# Patient Record
Sex: Female | Born: 1980 | Race: Black or African American | Hispanic: No | Marital: Single | Smoking: Never smoker
Health system: Southern US, Community
[De-identification: ages and names within clinical notes are randomized; demographics above are authoritative.]

## PROBLEM LIST (undated history)

## (undated) DIAGNOSIS — R51 Headache: Secondary | ICD-10-CM

## (undated) DIAGNOSIS — D649 Anemia, unspecified: Secondary | ICD-10-CM

## (undated) DIAGNOSIS — M25561 Pain in right knee: Secondary | ICD-10-CM

## (undated) DIAGNOSIS — J45909 Unspecified asthma, uncomplicated: Secondary | ICD-10-CM

## (undated) DIAGNOSIS — K760 Fatty (change of) liver, not elsewhere classified: Secondary | ICD-10-CM

## (undated) DIAGNOSIS — G4700 Insomnia, unspecified: Secondary | ICD-10-CM

## (undated) DIAGNOSIS — N390 Urinary tract infection, site not specified: Secondary | ICD-10-CM

## (undated) DIAGNOSIS — Z8759 Personal history of other complications of pregnancy, childbirth and the puerperium: Secondary | ICD-10-CM

## (undated) DIAGNOSIS — R140 Abdominal distension (gaseous): Secondary | ICD-10-CM

## (undated) DIAGNOSIS — R519 Headache, unspecified: Secondary | ICD-10-CM

## (undated) DIAGNOSIS — M25562 Pain in left knee: Secondary | ICD-10-CM

## (undated) HISTORY — DX: Headache: R51

## (undated) HISTORY — DX: Urinary tract infection, site not specified: N39.0

## (undated) HISTORY — DX: Unspecified asthma, uncomplicated: J45.909

## (undated) HISTORY — DX: Pain in right knee: M25.561

## (undated) HISTORY — DX: Personal history of other complications of pregnancy, childbirth and the puerperium: Z87.59

## (undated) HISTORY — DX: Headache, unspecified: R51.9

## (undated) HISTORY — DX: Insomnia, unspecified: G47.00

## (undated) HISTORY — DX: Abdominal distension (gaseous): R14.0

## (undated) HISTORY — DX: Fatty (change of) liver, not elsewhere classified: K76.0

## (undated) HISTORY — DX: Morbid (severe) obesity due to excess calories: E66.01

## (undated) HISTORY — DX: Pain in left knee: M25.562

## (undated) HISTORY — DX: Anemia, unspecified: D64.9

---

## 2015-07-14 VITALS — BP 118/82 | HR 71 | Temp 98.6°F | Ht 63.0 in | Wt 234.0 lb

## 2015-07-14 DIAGNOSIS — G43801 Other migraine, not intractable, with status migrainosus: Secondary | ICD-10-CM | POA: Diagnosis not present

## 2015-07-14 DIAGNOSIS — R51 Headache: Secondary | ICD-10-CM

## 2015-07-14 DIAGNOSIS — R519 Headache, unspecified: Secondary | ICD-10-CM

## 2015-07-14 DIAGNOSIS — D649 Anemia, unspecified: Secondary | ICD-10-CM

## 2015-07-14 NOTE — Progress Notes (Signed)
Pre visit review using our clinic review tool, if applicable. No additional management support is needed unless otherwise documented below in the visit note. 

## 2015-07-17 DIAGNOSIS — R519 Headache, unspecified: Secondary | ICD-10-CM | POA: Insufficient documentation

## 2015-07-17 DIAGNOSIS — Z8759 Personal history of other complications of pregnancy, childbirth and the puerperium: Secondary | ICD-10-CM | POA: Insufficient documentation

## 2015-07-17 DIAGNOSIS — J45909 Unspecified asthma, uncomplicated: Secondary | ICD-10-CM | POA: Insufficient documentation

## 2015-07-17 DIAGNOSIS — I10 Essential (primary) hypertension: Secondary | ICD-10-CM | POA: Insufficient documentation

## 2015-07-17 DIAGNOSIS — R51 Headache: Secondary | ICD-10-CM

## 2015-07-17 DIAGNOSIS — D649 Anemia, unspecified: Secondary | ICD-10-CM

## 2015-07-17 HISTORY — DX: Anemia, unspecified: D64.9

## 2015-07-17 NOTE — Assessment & Plan Note (Addendum)
Patient relays a history of headaches with migrainous components of N/V/photophobia and phonophobia dating back to middle school. Has experienced periods of increased intensity and frequency followed by long periods of doing better. She is presently having a rough patch recently. Over past year the frequency of her headaches and the intensity has worsened.  Will try Imitrex and Batalbitol prn, Encouraged increased hydration, 64 ounces of clear fluids daily. Minimize alcohol and caffeine. Eat small frequent meals with lean proteins and complex carbs. Avoid high and low blood sugars. Get adequate sleep, 7-8 hours a night. Needs exercise daily preferably in the morning. Labs unremarkable. If continues to worsen will refer to neurology

## 2015-07-19 NOTE — Telephone Encounter (Signed)
Called the patient informed her of her lab results dated 07/14/2015.  The patient verbally understood results/instructions

## 2015-08-25 VITALS — BP 108/68 | HR 78 | Temp 98.7°F | Ht 63.0 in | Wt 232.5 lb

## 2015-08-25 DIAGNOSIS — R140 Abdominal distension (gaseous): Secondary | ICD-10-CM | POA: Diagnosis not present

## 2015-08-25 DIAGNOSIS — R519 Headache, unspecified: Secondary | ICD-10-CM

## 2015-08-25 DIAGNOSIS — R51 Headache: Secondary | ICD-10-CM

## 2015-08-25 NOTE — Progress Notes (Signed)
Pre visit review using our clinic review tool, if applicable. No additional management support is needed unless otherwise documented below in the visit note. 

## 2015-09-04 DIAGNOSIS — R140 Abdominal distension (gaseous): Secondary | ICD-10-CM

## 2015-09-04 HISTORY — DX: Abdominal distension (gaseous): R14.0

## 2015-09-04 NOTE — Assessment & Plan Note (Signed)
Avoid offending foods, start probiotics. Do not eat large meals in late evening and consider raising head of bed.  

## 2015-09-04 NOTE — Progress Notes (Signed)
Subjective:    Patient ID: Teresa Mendoza, female    DOB: 10-23-1980, 35 y.o.   MRN: [id number]  Chief Complaint  Patient presents with  . Headache    HPI Patient is in today for follow up onheadaches. She has not been able to take sumatriptan due to cost concerns. Agrees to start soon Taking excedrin, works slightly. Occurs every other day, no real correlation to time of day. Hurts worse in the front of the head and bitemporally sometimes. Pain is not throbbing, lasts for 30-60 minutes. No aura described. Denies n/v/photophobia since last visit. Denies vision changes. Overall feels stress in life is not related. Adequate sleep, 6-7 hours.  Recently stopped drinking caffeine, does not drink alcohol. Will start headache log in [employer] App. Endorses feeling bloated at times, no anorexia, bloody or tarry stool. Denies CP/palp/SOB/HA/congestion/fevers or GU c/o. Taking meds as prescribed    Past Medical History  Diagnosis Date  . Frequent headaches   . UTI (lower urinary tract infection)   . Asthma   . History of gestational hypertension   . Anemia 07/17/2015  . Abdominal bloating 09/04/2015    Past Surgical History  Procedure Laterality Date  . Cesarean section  2008    Family History  Problem Relation Age of Onset  . Hyperlipidemia Mother   . Mental illness Mother   . Hypertension Mother   . Diabetes Mother   . Asthma Daughter   . Heart disease Maternal Uncle   . Arthritis Maternal Grandmother   . Heart disease Maternal Grandfather     Social History   Social History  . Marital Status: Single    Spouse Name: N/A  . Number of Children: N/A  . Years of Education: N/A   Occupational History  . Lab [employer]    Social History Main Topics  . Smoking status: Never Smoker   . Smokeless tobacco: Not on file  . Alcohol Use: 0.0 oz/week    0 Standard drinks or equivalent per week  . Drug Use: No  . Sexual Activity: Not on file   Other Topics Concern  . Not on file    Social History Narrative    Outpatient Prescriptions Prior to Visit  Medication Sig Dispense Refill  . SUMAtriptan (IMITREX) 100 MG tablet Take 1 tablet (100 mg total) by mouth every 2 (two) hours as needed for migraine. May repeat in 2 hours if headache persists or recurs. (Patient not taking: Reported on 08/25/2015) 10 tablet 5  . butalbital-acetaminophen-caffeine (FIORICET) 50-325-40 MG tablet Take 1-2 tablets by mouth daily. Reported on 08/25/2015     No facility-administered medications prior to visit.    No Known Allergies  Review of Systems  Constitutional: Negative for fever and malaise/fatigue.  HENT: Negative for congestion.   Eyes: Negative for discharge.  Respiratory: Negative for shortness of breath.   Cardiovascular: Negative for chest pain, palpitations and leg swelling.  Gastrointestinal: Positive for abdominal pain. Negative for nausea.  Genitourinary: Negative for dysuria.  Musculoskeletal: Negative for falls.  Skin: Negative for rash.  Neurological: Positive for headaches. Negative for loss of consciousness.  Endo/Heme/Allergies: Negative for environmental allergies.  Psychiatric/Behavioral: Negative for depression. The patient is not nervous/anxious.        Objective:    Physical Exam  Constitutional: She is oriented to person, place, and time. She appears well-developed and well-nourished. No distress.  HENT:  Head: Normocephalic and atraumatic.  Nose: Nose normal.  Eyes: Right eye exhibits no discharge. Left eye  exhibits no discharge.  Neck: Normal range of motion. Neck supple.  Cardiovascular: Normal rate and regular rhythm.   No murmur heard. Pulmonary/Chest: Effort normal and breath sounds normal.  Abdominal: Soft. Bowel sounds are normal. There is no tenderness.  Musculoskeletal: She exhibits no edema.  Neurological: She is alert and oriented to person, place, and time.  Skin: Skin is warm and dry.  Psychiatric: She has a normal mood and  affect.  Nursing note and vitals reviewed.   BP 108/68 mmHg  Pulse 78  Temp(Src) 98.7 F (37.1 C) (Oral)  Ht  (1.6 m)  Wt 232 lb 8 oz (105.461 kg)  BMI 41.20 kg/m2  SpO2 97% Wt Readings from Last 3 Encounters:  08/25/15 232 lb 8 oz (105.461 kg)  07/14/15 234 lb (106.142 kg)     Lab Results  Component Value Date   WBC 6.6 07/14/2015   HGB 11.2* 07/14/2015   HCT 35.5* 07/14/2015   PLT 329.0 07/14/2015   GLUCOSE 81 07/14/2015   ALT 11 07/14/2015   AST 14 07/14/2015   NA 140 07/14/2015   K 3.9 07/14/2015   CL 106 07/14/2015   CREATININE 0.84 07/14/2015   BUN 8 07/14/2015   CO2 28 07/14/2015   TSH 2.16 07/14/2015    Lab Results  Component Value Date   TSH 2.16 07/14/2015   Lab Results  Component Value Date   WBC 6.6 07/14/2015   HGB 11.2* 07/14/2015   HCT 35.5* 07/14/2015   MCV 72.9* 07/14/2015   PLT 329.0 07/14/2015   Lab Results  Component Value Date   NA 140 07/14/2015   K 3.9 07/14/2015   CO2 28 07/14/2015   GLUCOSE 81 07/14/2015   BUN 8 07/14/2015   CREATININE 0.84 07/14/2015   BILITOT 0.2 07/14/2015   ALKPHOS 67 07/14/2015   AST 14 07/14/2015   ALT 11 07/14/2015   PROT 7.2 07/14/2015   ALBUMIN 3.7 07/14/2015   CALCIUM 9.3 07/14/2015   GFR 100.82 07/14/2015   No results found for: CHOL No results found for: HDL No results found for: LDLCALC No results found for: TRIG No results found for: CHOLHDL No results found for: ZOXW9U     Assessment & Plan:   Problem List Items Addressed This Visit    Abdominal bloating    Avoid offending foods, start probiotics. Do not eat large meals in late evening and consider raising head of bed.       Frequent headaches - Primary    Encouraged increased hydration, 64 ounces of clear fluids daily. Minimize alcohol and caffeine. Eat small frequent meals with lean proteins and complex carbs. Avoid high and low blood sugars. Get adequate sleep, 7-8 hours a night. Needs exercise daily preferably in the  morning. Given rx for Fiorecet and Imitrex to try prn.      Relevant Medications   butalbital-acetaminophen-caffeine (FIORICET WITH CODEINE) 50-325-40-30 MG capsule      I have discontinued Ms. Patrone's butalbital-acetaminophen-caffeine. I am also having her start on butalbital-acetaminophen-caffeine. Additionally, I am having her maintain her SUMAtriptan.  Meds ordered this encounter  Medications  . butalbital-acetaminophen-caffeine (FIORICET WITH CODEINE) 50-325-40-30 MG capsule    Sig: Take 1 capsule by mouth every 4 (four) hours as needed for headache or migraine (max of 4 in 24 hour).    Dispense:  30 capsule    Refill:  1     Danise Edge, MD

## 2015-09-04 NOTE — Assessment & Plan Note (Addendum)
Encouraged increased hydration, 64 ounces of clear fluids daily. Minimize alcohol and caffeine. Eat small frequent meals with lean proteins and complex carbs. Avoid high and low blood sugars. Get adequate sleep, 7-8 hours a night. Needs exercise daily preferably in the morning. Given rx for Fiorecet and Imitrex to try prn.

## 2015-09-07 DIAGNOSIS — Z01419 Encounter for gynecological examination (general) (routine) without abnormal findings: Secondary | ICD-10-CM | POA: Insufficient documentation

## 2015-09-07 DIAGNOSIS — Z1151 Encounter for screening for human papillomavirus (HPV): Secondary | ICD-10-CM | POA: Diagnosis present

## 2015-11-24 VITALS — BP 112/74 | HR 73 | Temp 99.0°F | Ht 63.0 in | Wt 228.1 lb

## 2015-11-24 DIAGNOSIS — M25561 Pain in right knee: Secondary | ICD-10-CM

## 2015-11-24 DIAGNOSIS — R519 Headache, unspecified: Secondary | ICD-10-CM

## 2015-11-24 DIAGNOSIS — G43801 Other migraine, not intractable, with status migrainosus: Secondary | ICD-10-CM | POA: Diagnosis not present

## 2015-11-24 DIAGNOSIS — D649 Anemia, unspecified: Secondary | ICD-10-CM | POA: Diagnosis not present

## 2015-11-24 DIAGNOSIS — R51 Headache: Secondary | ICD-10-CM

## 2015-11-24 DIAGNOSIS — M25562 Pain in left knee: Secondary | ICD-10-CM

## 2015-11-24 HISTORY — DX: Morbid (severe) obesity due to excess calories: E66.01

## 2015-11-24 HISTORY — DX: Pain in right knee: M25.561

## 2015-11-24 HISTORY — DX: Pain in left knee: M25.562

## 2015-11-24 NOTE — Assessment & Plan Note (Addendum)
Headaches can last an hour at times other times can last 2 days. Can subside with rest. Sometimes gets relief from Imitrex and/or Butalibital. The frequency and intensity of headaches as well as length are improved with lifestyle changes

## 2015-11-24 NOTE — Progress Notes (Signed)
Pre visit review using our clinic review tool, if applicable. No additional management support is needed unless otherwise documented below in the visit note. 

## 2015-11-24 NOTE — Assessment & Plan Note (Signed)
Did not complete hemoccult at last visit, sent home with new kit, recheck CBC today. Does see occasional blood on tissue when straining with BM

## 2015-11-25 NOTE — Addendum Note (Signed)
Addended by: Erin HearingHEEK, Bartow Zylstra N on: 11/25/2015 01:59 PM   Modules accepted: Orders

## 2015-12-14 NOTE — Telephone Encounter (Signed)
Pt reports a feeling of "fullness" and "pulling" in her RLQ and right side starting this week, but says "it's not pain, it just feels different." "I just don't feel like myself." She endorses urinary urgency and frequency. Denies N/V, fever, SOB, CP, cloudy or foul smelling urine. Does not think she is pregnant, due to start period next week. Offered appt for today, but pt preferred to wait until tomorrow due to her work schedule. Scheduled at 8:15am 12/15/15 w/ Esperanza RichtersEdward Saguier, PA-C. Advised pt if she develops severe abd pain, fever, vomiting, then ED eval immediately and not to wait for appt. She agreed to instructions.

## 2015-12-14 NOTE — Telephone Encounter (Signed)
Relationship to patient: self Can be reached: [phone]  Reason for call: pt having sx of pain on right side/lower abdomen and wanting a call from nurse/CMA. Pt did not want to give me more info.

## 2015-12-15 VITALS — BP 110/76 | HR 83 | Temp 98.3°F | Ht 63.0 in | Wt 225.4 lb

## 2015-12-15 DIAGNOSIS — D649 Anemia, unspecified: Secondary | ICD-10-CM

## 2015-12-15 DIAGNOSIS — R1031 Right lower quadrant pain: Secondary | ICD-10-CM | POA: Insufficient documentation

## 2015-12-15 DIAGNOSIS — N390 Urinary tract infection, site not specified: Secondary | ICD-10-CM

## 2015-12-15 DIAGNOSIS — K439 Ventral hernia without obstruction or gangrene: Secondary | ICD-10-CM | POA: Insufficient documentation

## 2015-12-15 DIAGNOSIS — R82998 Other abnormal findings in urine: Secondary | ICD-10-CM

## 2015-12-15 NOTE — Progress Notes (Signed)
Pre visit review using our clinic review tool, if applicable. No additional management support is needed unless otherwise documented below in the visit note. 

## 2015-12-15 NOTE — Telephone Encounter (Signed)
Also do ifob to check stool for blood. Order placed. She can do labs within1-2 weeks.

## 2015-12-15 NOTE — Addendum Note (Signed)
Addended by: Neldon LabellaMABE, Icess Bertoni S on: 12/15/2015 09:52 AM   Modules accepted: Orders

## 2015-12-15 NOTE — Telephone Encounter (Signed)
Patient calling back regarding test results [phone]

## 2015-12-15 NOTE — Telephone Encounter (Signed)
Spoke with pt and she states that she had picked a ifob kit recently and she will find it and bring the sample back or mail it to [facility] when she completes the test.

## 2015-12-15 NOTE — Telephone Encounter (Signed)
Left message for pt to call back with any questions and that Teresa Mendoza would like the pt to come back to get a stool kit to check for blood in the stool. I advised her to call back with any questions.

## 2015-12-15 NOTE — Patient Instructions (Addendum)
For your recent pain rt lower side abdomen region will get cbc, cmp and CT  Abd/pelvis stat.  We need you to go down now to start contrast. Study will be done later this morning. Test is being done stat and we will call you with the result.   Please notify us if your symptoms worsen or change. Severe sign and symptom change after hours or weekend then ED evaluation.  Follow up in 7-10 days or as needed

## 2015-12-16 NOTE — Telephone Encounter (Signed)
I called pt but no answer. Regarding hernia. Radiologist stated minimal ventral hernia only containing fat. The smaller the hernia less likely to have complications. By the description I doubt she will have complication. Sometime if hernia enlarges can twist on itself and cause complication. We can periodically check on this area. If enlarges then refer to general surgeon. At the described size surgery referral not indicated unless she want us to refer.

## 2015-12-19 NOTE — Telephone Encounter (Signed)
Pt voices understanding and she seems more at ease with the conversation and I advised her of the note below.

## 2015-12-20 NOTE — Telephone Encounter (Signed)
Will you call pt and see if she is going to come in and get iron, tibc and ferritin level. I had recommended for anemia. Put those in as future orders.

## 2016-01-16 VITALS — BP 102/62 | HR 72 | Temp 98.9°F | Ht 63.0 in | Wt 224.4 lb

## 2016-01-16 DIAGNOSIS — D649 Anemia, unspecified: Secondary | ICD-10-CM | POA: Diagnosis not present

## 2016-01-16 DIAGNOSIS — Z0000 Encounter for general adult medical examination without abnormal findings: Secondary | ICD-10-CM

## 2016-01-16 DIAGNOSIS — R51 Headache: Secondary | ICD-10-CM

## 2016-01-16 DIAGNOSIS — K760 Fatty (change of) liver, not elsewhere classified: Secondary | ICD-10-CM

## 2016-01-16 DIAGNOSIS — R519 Headache, unspecified: Secondary | ICD-10-CM

## 2016-01-16 HISTORY — DX: Fatty (change of) liver, not elsewhere classified: K76.0

## 2016-01-16 NOTE — Assessment & Plan Note (Addendum)
Fatigue worse, Increase leafy greens, consider increased lean red meat and using cast iron cookware. Continue to monitor, report any concerns

## 2016-01-16 NOTE — Progress Notes (Signed)
Pre visit review using our clinic review tool, if applicable. No additional management support is needed unless otherwise documented below in the visit note. 

## 2016-01-17 DIAGNOSIS — D509 Iron deficiency anemia, unspecified: Secondary | ICD-10-CM

## 2016-01-17 DIAGNOSIS — D649 Anemia, unspecified: Secondary | ICD-10-CM

## 2016-01-17 DIAGNOSIS — R51 Headache: Secondary | ICD-10-CM | POA: Diagnosis not present

## 2016-01-17 DIAGNOSIS — Z0000 Encounter for general adult medical examination without abnormal findings: Secondary | ICD-10-CM

## 2016-01-17 DIAGNOSIS — R519 Headache, unspecified: Secondary | ICD-10-CM

## 2016-01-17 DIAGNOSIS — K760 Fatty (change of) liver, not elsewhere classified: Secondary | ICD-10-CM

## 2016-01-17 NOTE — Telephone Encounter (Signed)
Pt called in to request call back. She says in her appt yesterday she was asked if there were any other concerns. Pt says that she forgot to mention that her eyes flicker and flutter. She would like to make PCP aware. She would like to speak with CMA to see if that is normal.      [phone]

## 2016-01-17 NOTE — Telephone Encounter (Signed)
Called left message to call back 

## 2016-01-17 NOTE — Telephone Encounter (Signed)
You can have little fasciculations/muscle spasms in muscles in eyelids and that can be normal would recommend increasing water intake and starting a Magnesium supplement over the counter and to let us know if it continues or worsens.

## 2016-01-29 DIAGNOSIS — Z0000 Encounter for general adult medical examination without abnormal findings: Secondary | ICD-10-CM | POA: Insufficient documentation

## 2016-01-29 NOTE — Assessment & Plan Note (Signed)
Encouraged DASH diet, decrease po intake and increase exercise as tolerated. Needs 7-8 hours of sleep nightly. Avoid trans fats, eat small, frequent meals every 4-5 hours with lean proteins, complex carbs and healthy fats. Minimize simple carbs 

## 2016-01-29 NOTE — Progress Notes (Signed)
Patient ID: Teresa Mendoza, female   DOB: 03/24/1981, 35 y.o.   MRN: [id number]   Subjective:    Patient ID: Teresa Mendoza, female    DOB: 02/19/1981, 35 y.o.   MRN: [id number]  Chief Complaint  Patient presents with  . Annual Exam    HPI Patient is in today for annual exam. Has been well for the most part. No recent fever or acute illness. Only concern is some recent mild RLQ pain. No change in bowel habits fever or severe pain. Denies CP/palp/SOB/HA/congestion/fevers/GI or GU c/o. Taking meds as prescribed  Past Medical History  Diagnosis Date  . Frequent headaches   . UTI (lower urinary tract infection)   . Asthma   . History of gestational hypertension   . Anemia 07/17/2015  . Abdominal bloating 09/04/2015  . Morbid obesity (HCC) 11/24/2015  . Knee pain, bilateral 11/24/2015  . Fatty liver disease, nonalcoholic 01/16/2016    Past Surgical History  Procedure Laterality Date  . Cesarean section  2008    Family History  Problem Relation Age of Onset  . Hyperlipidemia Mother   . Mental illness Mother   . Hypertension Mother   . Diabetes Mother   . Asthma Daughter   . Heart disease Maternal Uncle   . Arthritis Maternal Grandmother   . Heart disease Maternal Grandfather     Social History   Social History  . Marital Status: Single    Spouse Name: N/A  . Number of Children: N/A  . Years of Education: N/A   Occupational History  . Lab [employer]    Social History Main Topics  . Smoking status: Never Smoker   . Smokeless tobacco: Not on file  . Alcohol Use: 0.0 oz/week    0 Standard drinks or equivalent per week  . Drug Use: No  . Sexual Activity: Not on file   Other Topics Concern  . 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   No major dietary restrictions   Lives with fiance and 177 year old daughter, 33 year old daughter and 10164 year old grandson.     Outpatient Prescriptions Prior to Visit  Medication Sig  Dispense Refill  . butalbital-acetaminophen-caffeine (FIORICET WITH CODEINE) 50-325-40-30 MG capsule Take 1 capsule by mouth every 4 (four) hours as needed for headache or migraine (max of 4 in 24 hour). 30 capsule 1  . Ferrous Fumarate-Folic Acid (HEMOCYTE-F) 324-1 MG TABS Take 1 tablet by mouth once. 30 each 3  . SUMAtriptan (IMITREX) 100 MG tablet Take 1 tablet (100 mg total) by mouth every 2 (two) hours as needed for migraine. May repeat in 2 hours if headache persists or recurs. 10 tablet 5   No facility-administered medications prior to visit.    No Known Allergies  Review of Systems  Constitutional: Negative for fever, chills and malaise/fatigue.  HENT: Negative for congestion and hearing loss.   Eyes: Negative for discharge.  Respiratory: Negative for cough, sputum production and shortness of breath.   Cardiovascular: Negative for chest pain, palpitations and leg swelling.  Gastrointestinal: Positive for abdominal pain. Negative for heartburn, nausea, vomiting, diarrhea, constipation and blood in stool.  Genitourinary: Negative for dysuria, urgency, frequency and hematuria.  Musculoskeletal: Negative for myalgias, back pain and falls.  Skin: Negative for rash.  Neurological: Negative for dizziness, sensory change, loss of consciousness, weakness and headaches.  Endo/Heme/Allergies: Negative for environmental allergies. Does not bruise/bleed easily.  Psychiatric/Behavioral: Negative for depression  and suicidal ideas. The patient is not nervous/anxious and does not have insomnia.        Objective:    Physical Exam  Constitutional: She is oriented to person, place, and time. She appears well-developed and well-nourished. No distress.  HENT:  Head: Normocephalic and atraumatic.  Eyes: Conjunctivae are normal.  Neck: Neck supple. No thyromegaly present.  Cardiovascular: Normal rate, regular rhythm and normal heart sounds.   No murmur heard. Pulmonary/Chest: Effort normal and  breath sounds normal. No respiratory distress.  Abdominal: Soft. Bowel sounds are normal. She exhibits no distension and no mass. There is no tenderness.  Musculoskeletal: She exhibits no edema.  Lymphadenopathy:    She has no cervical adenopathy.  Neurological: She is alert and oriented to person, place, and time.  Skin: Skin is warm and dry.  Psychiatric: She has a normal mood and affect. Her behavior is normal.    BP 102/62 mmHg  Pulse 72  Temp(Src) 98.9 F (37.2 C) (Oral)  Ht 5\' 3"  (1.6 m)  Wt 224 lb 6 oz (101.776 kg)  BMI 39.76 kg/m2  SpO2 97% Wt Readings from Last 3 Encounters:  01/16/16 224 lb 6 oz (101.776 kg)  12/15/15 225 lb 6.4 oz (102.241 kg)  11/24/15 228 lb 2 oz (103.477 kg)     Lab Results  Component Value Date   WBC 5.9 01/16/2016   HGB 11.0* 01/16/2016   HCT 34.5* 01/16/2016   PLT 260.0 01/16/2016   GLUCOSE 82 12/15/2015   CHOL 138 01/16/2016   TRIG 56.0 01/16/2016   HDL 44.60 01/16/2016   LDLCALC 82 01/16/2016   ALT 11 12/15/2015   AST 14 12/15/2015   NA 139 12/15/2015   K 4.0 12/15/2015   CL 106 12/15/2015   CREATININE 0.75 12/15/2015   BUN 10 12/15/2015   CO2 26 12/15/2015   TSH 3.46 01/16/2016    Lab Results  Component Value Date   TSH 3.46 01/16/2016   Lab Results  Component Value Date   WBC 5.9 01/16/2016   HGB 11.0* 01/16/2016   HCT 34.5* 01/16/2016   MCV 72.4* 01/16/2016   PLT 260.0 01/16/2016   Lab Results  Component Value Date   NA 139 12/15/2015   K 4.0 12/15/2015   CO2 26 12/15/2015   GLUCOSE 82 12/15/2015   BUN 10 12/15/2015   CREATININE 0.75 12/15/2015   BILITOT 0.3 12/15/2015   ALKPHOS 56 12/15/2015   AST 14 12/15/2015   ALT 11 12/15/2015   PROT 7.5 12/15/2015   ALBUMIN 3.9 12/15/2015   CALCIUM 9.3 12/15/2015   GFR 114.61 12/15/2015   Lab Results  Component Value Date   CHOL 138 01/16/2016   Lab Results  Component Value Date   HDL 44.60 01/16/2016   Lab Results  Component Value Date   LDLCALC 82  01/16/2016   Lab Results  Component Value Date   TRIG 56.0 01/16/2016   Lab Results  Component Value Date   CHOLHDL 3 01/16/2016   No results found for: HGBA1C     Assessment & Plan:   Problem List Items Addressed This Visit    Preventative health care - Primary    Patient encouraged to maintain heart healthy diet, regular exercise, adequate sleep. Consider daily probiotics. Take medications as prescribed. Given and reviewed [place] [facility] [place] [facility] [place] [facility] [place] and encouraged to complete and return      Relevant Orders   Lipid panel (Completed)   CBC (Completed)   TSH (  Completed)   Fecal occult blood, imunochemical (Completed)   Morbid obesity (HCC)    Encouraged DASH diet, decrease po intake and increase exercise as tolerated. Needs 7-8 hours of sleep nightly. Avoid trans fats, eat small, frequent meals every 4-5 hours with lean proteins, complex carbs and healthy fats. Minimize simple carbs      Frequent headaches   Relevant Orders   Fecal occult blood, imunochemical (Completed)   Fatty liver disease, nonalcoholic   Relevant Orders   Fecal occult blood, imunochemical (Completed)   Anemia    Fatigue worse, Increase leafy greens, consider increased lean red meat and using cast iron cookware. Continue to monitor, report any concerns      Relevant Orders   Fecal occult blood, imunochemical (Completed)      I am having Ms. Mirkin maintain her SUMAtriptan, butalbital-acetaminophen-caffeine, and Ferrous Fumarate-Folic Acid.  No orders of the defined types were placed in this encounter.     Danise Edge, MD

## 2016-01-29 NOTE — Assessment & Plan Note (Signed)
Patient encouraged to maintain heart healthy diet, regular exercise, adequate sleep. Consider daily probiotics. Take medications as prescribed. Given and reviewed copy of ACP documents from [place] Secretary of State and encouraged to complete and return 

## 2016-04-19 VITALS — BP 114/80 | HR 59 | Temp 98.5°F | Ht 62.5 in | Wt 224.5 lb

## 2016-04-19 DIAGNOSIS — Z8759 Personal history of other complications of pregnancy, childbirth and the puerperium: Secondary | ICD-10-CM | POA: Diagnosis not present

## 2016-04-19 DIAGNOSIS — R51 Headache: Secondary | ICD-10-CM

## 2016-04-19 DIAGNOSIS — D649 Anemia, unspecified: Secondary | ICD-10-CM

## 2016-04-19 DIAGNOSIS — R519 Headache, unspecified: Secondary | ICD-10-CM

## 2016-04-19 DIAGNOSIS — G4700 Insomnia, unspecified: Secondary | ICD-10-CM

## 2016-04-19 NOTE — Patient Instructions (Signed)

## 2016-04-19 NOTE — Progress Notes (Signed)
Pre visit review using our clinic review tool, if applicable. No additional management support is needed unless otherwise documented below in the visit note. 

## 2016-04-29 DIAGNOSIS — G4700 Insomnia, unspecified: Secondary | ICD-10-CM

## 2016-04-29 HISTORY — DX: Insomnia, unspecified: G47.00

## 2016-04-29 NOTE — Assessment & Plan Note (Signed)
Encouraged good sleep hygiene such as dark, quiet room. No blue/green glowing lights such as computer screens in bedroom. No alcohol or stimulants in evening. Cut down on caffeine as able. Regular exercise is helpful but not just prior to bed time.  

## 2016-04-29 NOTE — Assessment & Plan Note (Signed)
Encouraged DASH diet, decrease po intake and increase exercise as tolerated. Needs 7-8 hours of sleep nightly. Avoid trans fats, eat small, frequent meals every 4-5 hours with lean proteins, complex carbs and healthy fats. Minimize simple carbs 

## 2016-07-17 DIAGNOSIS — R519 Headache, unspecified: Secondary | ICD-10-CM

## 2016-07-17 DIAGNOSIS — Z23 Encounter for immunization: Secondary | ICD-10-CM | POA: Diagnosis not present

## 2016-07-17 DIAGNOSIS — D649 Anemia, unspecified: Secondary | ICD-10-CM | POA: Diagnosis not present

## 2016-07-17 DIAGNOSIS — M25561 Pain in right knee: Secondary | ICD-10-CM

## 2016-07-17 DIAGNOSIS — R51 Headache: Secondary | ICD-10-CM | POA: Diagnosis not present

## 2016-07-17 DIAGNOSIS — F5101 Primary insomnia: Secondary | ICD-10-CM

## 2016-07-17 DIAGNOSIS — K760 Fatty (change of) liver, not elsewhere classified: Secondary | ICD-10-CM | POA: Diagnosis not present

## 2016-07-17 DIAGNOSIS — M25562 Pain in left knee: Secondary | ICD-10-CM

## 2016-07-17 DIAGNOSIS — G8929 Other chronic pain: Secondary | ICD-10-CM

## 2016-07-17 NOTE — Assessment & Plan Note (Signed)
Encouraged increased hydration, 64 ounces of clear fluids daily. Minimize alcohol and caffeine. Eat small frequent meals with lean proteins and complex carbs. Avoid high and low blood sugars. Get adequate sleep, 7-8 hours a night. Needs exercise daily preferably in the morning.given rx for Bupap

## 2016-07-17 NOTE — Assessment & Plan Note (Signed)
Increase leafy greens, consider increased lean red meat and using cast iron cookware. Continue to monitor, report any concerns. Check level

## 2016-07-17 NOTE — Assessment & Plan Note (Signed)
Encouraged DASH diet, decrease po intake and increase exercise as tolerated. Needs 7-8 hours of sleep nightly. Avoid trans fats, eat small, frequent meals every 4-5 hours with lean proteins, complex carbs and healthy fats. Minimize simple carbs and consider bariatric referral

## 2016-07-17 NOTE — Progress Notes (Signed)
Pre visit review using our clinic review tool, if applicable. No additional management support is needed unless otherwise documented below in the visit note. 

## 2016-07-17 NOTE — Assessment & Plan Note (Signed)
Check cmp today 

## 2016-08-01 NOTE — Telephone Encounter (Signed)
[name], do you happen to have a copy of FMLA forms?

## 2016-08-01 NOTE — Telephone Encounter (Signed)
Relation to JW:JXBJpt:self Call back [phone]   Reason for call:  Patient was advised by [employer] FMLA forms were filled out incorrectly missing office visit portion supporting patient chronic head aches please re faxed signed and dated to [phone]. Deadline is within the next 10 days, please advise

## 2016-08-02 NOTE — Telephone Encounter (Signed)
Patient would like a call back to go over some questions she has regarding the forms she talked about having faxed. Please advise  Patient phone: [phone]

## 2016-08-03 NOTE — Telephone Encounter (Signed)
Spoke with the pt on (Thurs-08/02/16) regarding the FMLA form.  Informed the pt that we are unable to locate the original form which is sent to be scanned.  Asked the pt if she could send or bring us another form so that it could be completed and corrected.  Pt stated that the section for the office visit needed to be completed.  Asked the pt if she could either sent of bring another form with what needs to be completed and what mainly needs to be done.  Pt verbalized understanding and agreed.  Pt brought in the part of the FMLA form that needed to be completed and corrected.//AB/CMA

## 2016-08-07 NOTE — Progress Notes (Signed)
Patient ID: Teresa Mendoza, female   DOB: 02/04/1981, 36 y.o.   MRN: [id number]   Subjective:    Patient ID: Teresa Mendoza, female    DOB: [birth year], 36 y.o.   MRN: [id number]  Chief Complaint  Patient presents with  . Follow-up    HPI Patient is in today for follow up. No recent illness or acute concerns. She continues to struggle chronic knee pain but stays active. No recent injury or redness. Continues to struggle with intermittent headaches but they are less frequent and her meds do help when she needs them. Denies CP/palp/SOBcong/estion/fevers/GI or GU c/o. Taking meds as prescribed  Past Medical History:  Diagnosis Date  . Abdominal bloating 09/04/2015  . Anemia 07/17/2015  . Asthma   . Fatty liver disease, nonalcoholic 3/71/0626  . Frequent headaches   . History of gestational hypertension   . Insomnia 04/29/2016  . Knee pain, bilateral 11/24/2015  . Morbid obesity ([place]) 11/24/2015  . UTI (lower urinary tract infection)     Past Surgical History:  Procedure Laterality Date  . CESAREAN SECTION  2008    Family History  Problem Relation Age of Onset  . Hyperlipidemia Mother   . Mental illness Mother   . Hypertension Mother   . Diabetes Mother   . Asthma Daughter   . Heart disease Maternal Uncle   . Arthritis Maternal Grandmother   . Heart disease Maternal Grandfather     Social History   Social History  . Marital status: Single    Spouse name: N/A  . Number of children: N/A  . Years of education: N/A   Occupational History  . [employer]    Social History Main Topics  . Smoking status: Never Smoker  . Smokeless tobacco: Not on file  . Alcohol use 0.0 oz/week  . Drug use: No  . Sexual activity: Not on file   Other Topics Concern  . 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   No major dietary restrictions   Lives with fiance and 2 year old daughter, 60 year old daughter and 59 year old grandson.      Outpatient Medications Prior to Visit  Medication Sig Dispense Refill  . Ferrous Fumarate-Folic Acid (HEMOCYTE-F) 324-1 MG TABS Take 1 tablet by mouth once. 30 each 3  . promethazine (PHENERGAN) 25 MG tablet Take 1 tablet (25 mg total) by mouth every 8 (eight) hours as needed for nausea or vomiting. 30 tablet 1  . butalbital-acetaminophen-caffeine (FIORICET WITH CODEINE) 50-325-40-30 MG capsule Take 1 capsule by mouth every 4 (four) hours as needed for headache or migraine (max of 4 in 24 hour). 30 capsule 1  . rizatriptan (MAXALT) 10 MG tablet Take 1 tablet (10 mg total) by mouth as needed for migraine. May repeat in 2 hours if needed 10 tablet 5   No facility-administered medications prior to visit.     No Known Allergies  Review of Systems  Constitutional: Negative for fever and malaise/fatigue.  HENT: Negative for congestion.   Eyes: Negative for blurred vision.  Respiratory: Negative for shortness of breath.   Cardiovascular: Negative for chest pain, palpitations and leg swelling.  Gastrointestinal: Negative for abdominal pain, blood in stool and nausea.  Genitourinary: Negative for dysuria and frequency.  Musculoskeletal: Positive for joint pain. Negative for falls.  Skin: Negative for rash.  Neurological: Positive for headaches. Negative for dizziness and loss of consciousness.  Endo/Heme/Allergies: Negative for environmental allergies.  Psychiatric/Behavioral: Negative for depression. The patient is not nervous/anxious.        Objective:    Physical Exam  Constitutional: She is oriented to person, place, and time. She appears well-developed and well-nourished. No distress.  HENT:  Head: Normocephalic and atraumatic.  Nose: Nose normal.  Eyes: Right eye exhibits no discharge. Left eye exhibits no discharge.  Neck: Normal range of motion. Neck supple.  Cardiovascular: Normal rate and regular rhythm.   No murmur heard. Pulmonary/Chest: Effort normal and breath sounds  normal.  Abdominal: Soft. Bowel sounds are normal. There is no tenderness.  Musculoskeletal: She exhibits no edema.  Neurological: She is alert and oriented to person, place, and time.  Skin: Skin is warm and dry.  Psychiatric: She has a normal mood and affect.  Nursing note and vitals reviewed.   BP 120/80 (BP Location: Left Arm, Patient Position: Sitting, Cuff Size: Large)   Pulse 81   Temp 98.4 F (36.9 C) (Oral)   Wt 236 lb 3.2 oz (107.1 kg)   LMP 06/28/2016 (Exact Date)   SpO2 97% Comment: RA  BMI 42.51 kg/m  Wt Readings from Last 3 Encounters:  07/17/16 236 lb 3.2 oz (107.1 kg)  04/19/16 224 lb 8 oz (101.8 kg)  01/16/16 224 lb 6 oz (101.8 kg)     Lab Results  Component Value Date   WBC 6.2 07/17/2016   HGB 11.7 (L) 07/17/2016   HCT 36.7 07/17/2016   PLT 263.0 07/17/2016   GLUCOSE 67 (L) 07/17/2016   CHOL 138 01/16/2016   TRIG 56.0 01/16/2016   HDL 44.60 01/16/2016   LDLCALC 82 01/16/2016   ALT 11 07/17/2016   AST 15 07/17/2016   NA 138 07/17/2016   K 4.4 07/17/2016   CL 104 07/17/2016   CREATININE 0.71 07/17/2016   BUN 7 07/17/2016   CO2 28 07/17/2016   TSH 3.16 07/17/2016    Lab Results  Component Value Date   TSH 3.16 07/17/2016   Lab Results  Component Value Date   WBC 6.2 07/17/2016   HGB 11.7 (L) 07/17/2016   HCT 36.7 07/17/2016   MCV 73.4 (L) 07/17/2016   PLT 263.0 07/17/2016   Lab Results  Component Value Date   NA 138 07/17/2016   K 4.4 07/17/2016   CO2 28 07/17/2016   GLUCOSE 67 (L) 07/17/2016   BUN 7 07/17/2016   CREATININE 0.71 07/17/2016   BILITOT 0.2 07/17/2016   ALKPHOS 67 07/17/2016   AST 15 07/17/2016   ALT 11 07/17/2016   PROT 7.3 07/17/2016   ALBUMIN 3.8 07/17/2016   CALCIUM 9.4 07/17/2016   GFR 121.65 07/17/2016   Lab Results  Component Value Date   CHOL 138 01/16/2016   Lab Results  Component Value Date   HDL 44.60 01/16/2016   Lab Results  Component Value Date   LDLCALC 82 01/16/2016   Lab Results    Component Value Date   TRIG 56.0 01/16/2016   Lab Results  Component Value Date   CHOLHDL 3 01/16/2016   No results found for: HGBA1C     Assessment & Plan:   Problem List Items Addressed This Visit    Frequent headaches    Encouraged increased hydration, 64 ounces of clear fluids daily. Minimize alcohol and caffeine. Eat small frequent meals with lean proteins and complex carbs. Avoid high and low blood sugars. Get adequate sleep, 7-8 hours a night. Needs exercise daily preferably in the morning.given rx for Bupap  Relevant Medications   Butalbital-APAP-Caffeine 50-300-40 MG CAPS   rizatriptan (MAXALT) 10 MG tablet   Other Relevant Orders   Comp Met (CMET) (Completed)   Anemia    Increase leafy greens, consider increased lean red meat and using cast iron cookware. Continue to monitor, report any concerns. Check level      Relevant Orders   CBC w/Diff (Completed)   Morbid obesity ([place])    Encouraged DASH diet, decrease po intake and increase exercise as tolerated. Needs 7-8 hours of sleep nightly. Avoid trans fats, eat small, frequent meals every 4-5 hours with lean proteins, complex carbs and healthy fats. Minimize simple carbs and consider bariatric referral      Relevant Orders   TSH (Completed)   Knee pain, bilateral    Encouraged moist heat and gentle stretching as tolerated. May try NSAIDs and prescription meds as directed and report if symptoms worsen or seek immediate care. Stay active, try topical treatments.       Fatty liver disease, nonalcoholic    Check cmp today      Insomnia    Encouraged good sleep hygiene such as dark, quiet room. No blue/green glowing lights such as computer screens in bedroom. No alcohol or stimulants in evening. Cut down on caffeine as able. Regular exercise is helpful but not just prior to bed time.        Other Visit Diagnoses    Encounter for immunization       Relevant Medications   Butalbital-APAP-Caffeine 50-300-40 MG  CAPS   rizatriptan (MAXALT) 10 MG tablet   Other Relevant Orders   Flu Vaccine QUAD 36+ mos IM (Completed)      I have discontinued Ms. Sigal's butalbital-acetaminophen-caffeine. I am also having her start on Butalbital-APAP-Caffeine. Additionally, I am having her maintain her Ferrous Fumarate-Folic Acid, promethazine, and rizatriptan.  Meds ordered this encounter  Medications  . Butalbital-APAP-Caffeine 50-300-40 MG CAPS    Sig: Take 1 capsule by mouth 3 (three) times daily as needed.    Dispense:  60 capsule    Refill:  0  . rizatriptan (MAXALT) 10 MG tablet    Sig: Take 1 tablet (10 mg total) by mouth as needed for migraine. May repeat in 2 hours if needed    Dispense:  10 tablet    Refill:  5     Penni Homans, MD

## 2016-08-07 NOTE — Assessment & Plan Note (Signed)
Encouraged good sleep hygiene such as dark, quiet room. No blue/green glowing lights such as computer screens in bedroom. No alcohol or stimulants in evening. Cut down on caffeine as able. Regular exercise is helpful but not just prior to bed time.  

## 2016-08-07 NOTE — Assessment & Plan Note (Signed)
Encouraged moist heat and gentle stretching as tolerated. May try NSAIDs and prescription meds as directed and report if symptoms worsen or seek immediate care. Stay active, try topical treatments.

## 2016-08-10 NOTE — Telephone Encounter (Signed)
Received corrected and completed FMLA form from Dr. Abner GreenspanBlyth on (08/03/16).  FMLA faxed on (08/03/16) to [employer] at [phone]).  Confirmation received.  Form to be scanned into the chart.//AB/CMA

## 2016-08-17 NOTE — Telephone Encounter (Signed)
Pt dropped off FMLA paperwork, documents placed in tray at front desk

## 2016-08-20 NOTE — Telephone Encounter (Signed)
Looks like this was already done? Just needs different dates? If so print off last form and I will update

## 2016-08-20 NOTE — Telephone Encounter (Signed)
Relation to pt: self Call back number:[phone]   Reason for call:  Patient wanted to inform PCP FMLA start date is 07/19/16 to 01/16/17

## 2016-08-28 NOTE — Telephone Encounter (Signed)
Forms done/faxed/scanned into chart.  Patient notified of completion through mychart.

## 2016-09-17 NOTE — Telephone Encounter (Signed)
PCP Received a mychart with message with another change to her FMLA. Please see mychart message dated 09/17/2016 PCP did correct as the patient requested and I faxed forms, copied and sent to scan. I DID PUT ORIGINAL IN ANGIES OFFICE IN FILE FOLDER

## 2016-10-18 VITALS — BP 120/81 | HR 80 | Temp 98.4°F | Wt 246.6 lb

## 2016-10-18 DIAGNOSIS — D649 Anemia, unspecified: Secondary | ICD-10-CM | POA: Diagnosis not present

## 2016-10-18 DIAGNOSIS — G479 Sleep disorder, unspecified: Secondary | ICD-10-CM | POA: Diagnosis not present

## 2016-10-18 DIAGNOSIS — R51 Headache: Secondary | ICD-10-CM

## 2016-10-18 DIAGNOSIS — F5101 Primary insomnia: Secondary | ICD-10-CM

## 2016-10-18 DIAGNOSIS — R519 Headache, unspecified: Secondary | ICD-10-CM

## 2016-10-18 NOTE — Progress Notes (Signed)
Pre visit review using our clinic review tool, if applicable. No additional management support is needed unless otherwise documented below in the visit note. 

## 2016-10-18 NOTE — Assessment & Plan Note (Signed)
Encouraged increased hydration, 64 ounces of clear fluids daily. Minimize alcohol and caffeine. Eat small frequent meals with lean proteins and complex carbs. Avoid high and low blood sugars. Get adequate sleep, 7-8 hours a night. Needs exercise daily preferably in the morning.intensity of headaches has improved with medications but still ocurs freqently.

## 2016-10-18 NOTE — Assessment & Plan Note (Signed)
Encouraged good sleep hygiene such as dark, quiet room. No blue/green glowing lights such as computer screens in bedroom. No alcohol or stimulants in evening. Cut down on caffeine as able. Regular exercise is helpful but not just prior to bed time. With restless sleep, fatigue, headaches, snoring will proceed with pulmonology and sleep study

## 2016-10-18 NOTE — Assessment & Plan Note (Signed)
Encouraged DASH diet, decrease po intake and increase exercise as tolerated. Needs 7-8 hours of sleep nightly. Avoid trans fats, eat small, frequent meals every 4-5 hours with lean proteins, complex carbs and healthy fats. Minimize simple carb 

## 2016-10-21 NOTE — Assessment & Plan Note (Signed)
Mild, asymptomatic, Increase leafy greens, consider increased lean red meat and using cast iron cookware. Continue to monitor, report any concerns 

## 2016-11-23 IMAGING — CT CT ABD-PELV W/ CM
2 of 4 series · 16 of 46 positions shown, 18 images · IV contrast (APPLIED)
Comparison: None.

CLINICAL DATA: Right lower quadrant pain and tenderness for 2 days

EXAM:
CT ABDOMEN AND PELVIS WITH CONTRAST
TECHNIQUE: Multidetector CT imaging of the abdomen and pelvis was performed
using the standard protocol following bolus administration of
intravenous contrast. Oral contrast was also administered.
CONTRAST:  100mL EFEMRS-QSS IOPAMIDOL (EFEMRS-QSS) INJECTION 61%

[Series 2: axial st · axial · 0.98mm/px · z∈[-482,-42]mm · 13 of 96 slices shown, 15 images]
[im 4/96  soft-tissue]
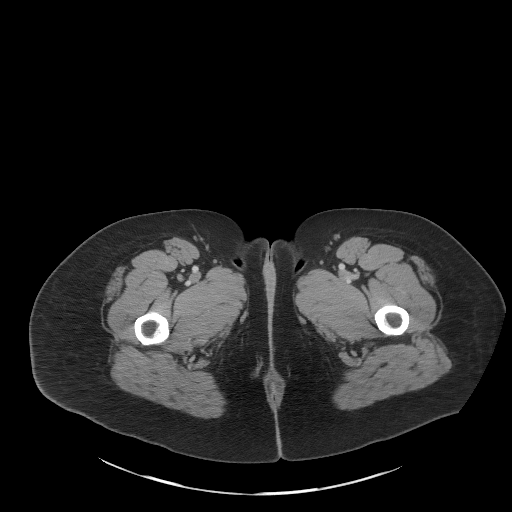
[im 4/96  bone]
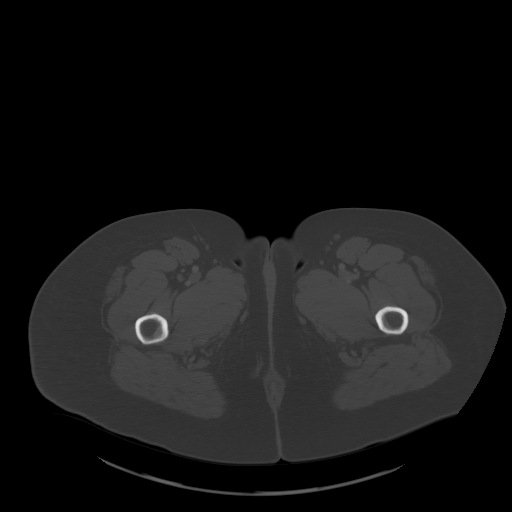
[im 12/96  soft-tissue]
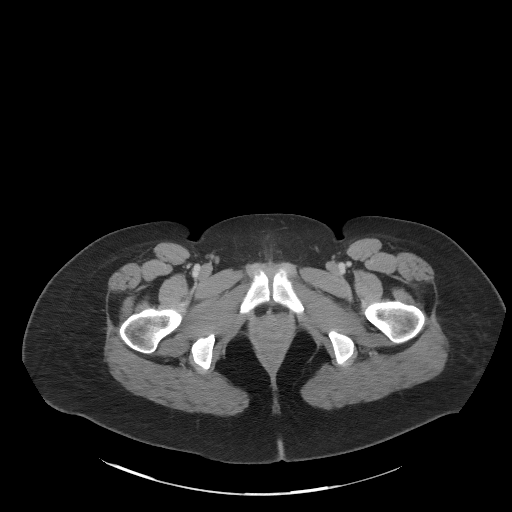
[im 20/96  soft-tissue]
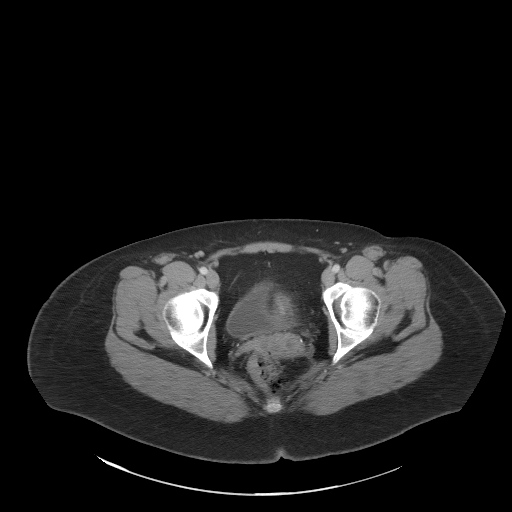
[im 28/96  soft-tissue]
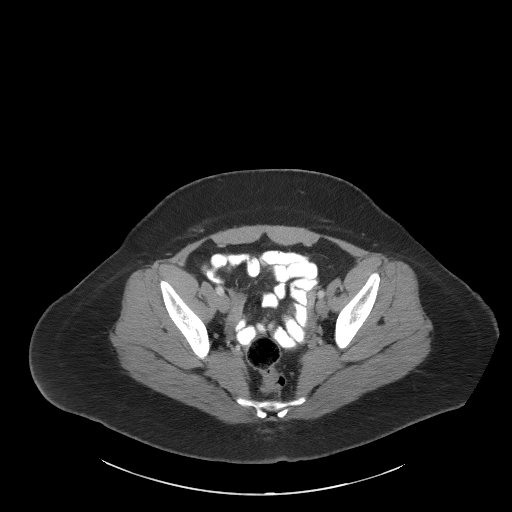
[im 32/96  soft-tissue]
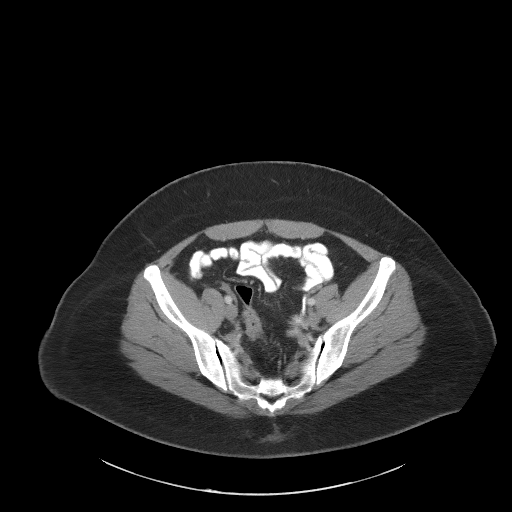
[im 40/96  soft-tissue]
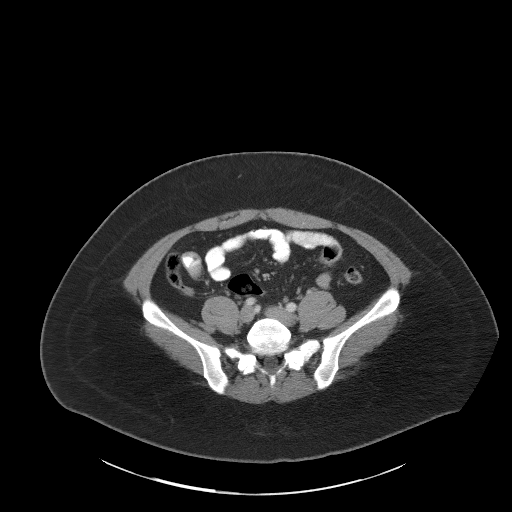
[im 48/96  soft-tissue]
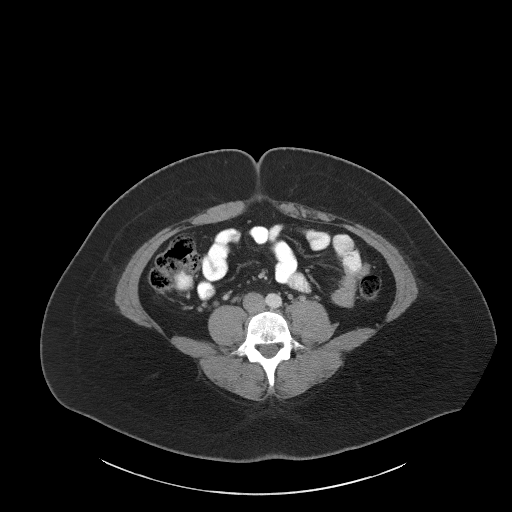
[im 56/96  soft-tissue]
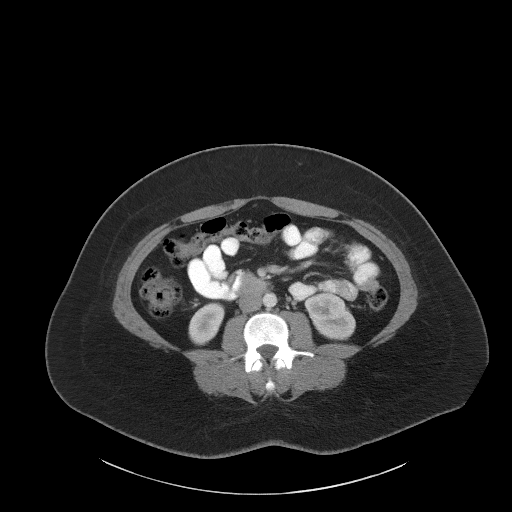
[im 64/96  soft-tissue]
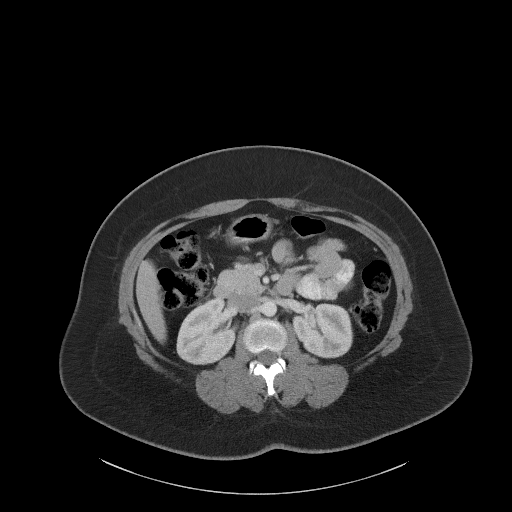
[im 64/96  bone]
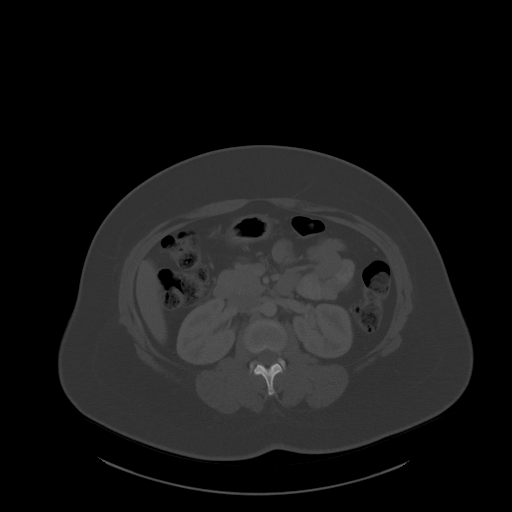
[im 68/96  soft-tissue]
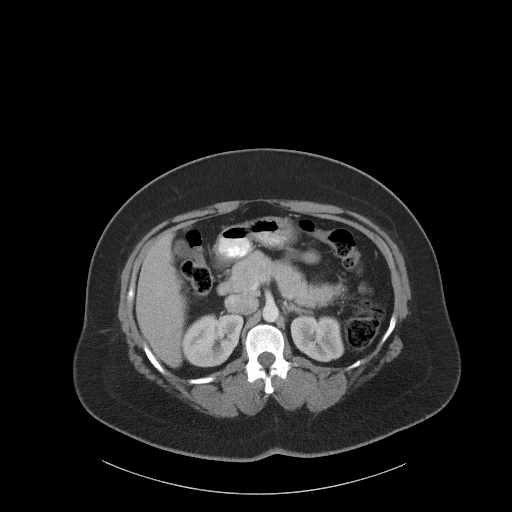
[im 76/96  soft-tissue]
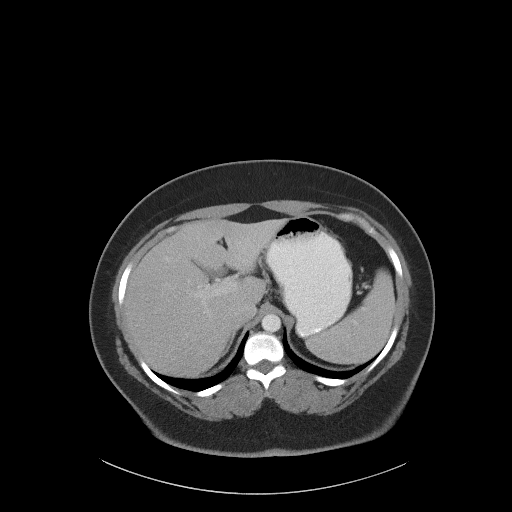
[im 84/96  soft-tissue]
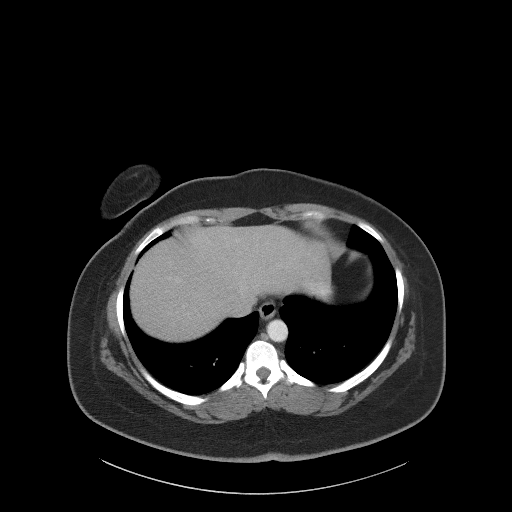
[im 92/96  soft-tissue]
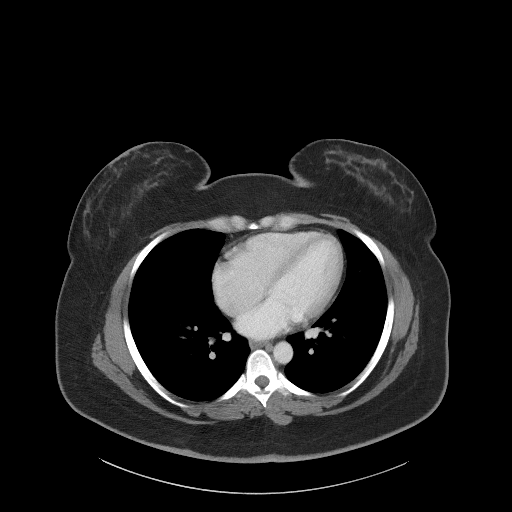

[Series 5: coronal st · coronal · 0.82mm/px · 3 of 110 slices shown]
[im 37/110  soft-tissue]
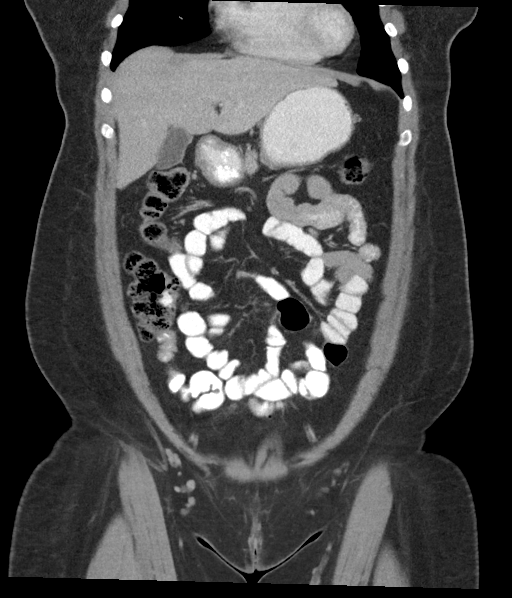
[im 49/110  soft-tissue]
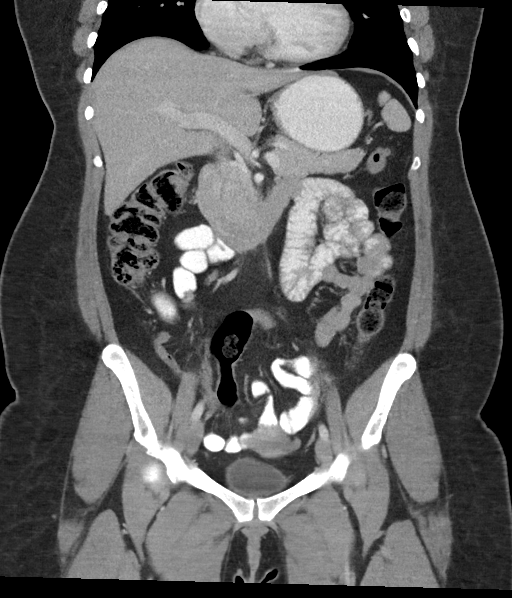
[im 61/110  soft-tissue]
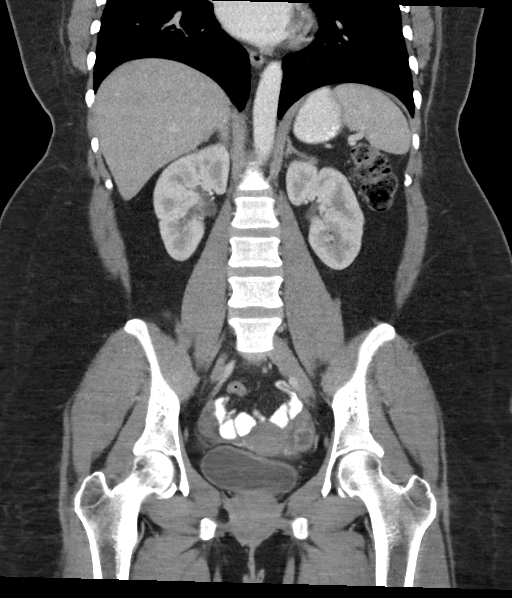

[16 of 46 positions shown; findings below may reference images not displayed]

FINDINGS: Lower chest:  Lung bases are clear.

Hepatobiliary: There is mild fatty infiltration near the fissure for
the ligamentum teres. No focal liver lesions are identified.
Gallbladder wall is not appreciably thickened. There is no biliary
duct dilatation.

Pancreas: There is no pancreatic mass or inflammatory focus.

Spleen: No splenic lesions are evident.

Adrenals/Urinary Tract: Adrenals appear normal bilaterally. Kidneys
bilaterally show no mass or hydronephrosis on either side. There is
no renal or ureteral calculus on either side. Urinary bladder is
midline with wall thickness within normal limits.

Stomach/Bowel: There is no bowel wall or mesenteric thickening.
There is no demonstrable bowel obstruction. No free air or portal
venous air.

Vascular/Lymphatic: There is no abdominal aortic aneurysm. No
vascular lesions are evident. There is no appreciable adenopathy in
the abdomen or pelvis.

Reproductive: Uterus is anteverted. There is no pelvic mass or
pelvic fluid collection.

Other: The appendix appears normal. There is no ascites or abscess
in the abdomen or pelvis. There is a minimal ventral hernia
containing only fat.

Musculoskeletal: There are no blastic or lytic bone lesions. No
intramuscular or abdominal wall lesion.
IMPRESSION: A cause for patient's symptoms has not been established with this
study.

Appendix appears normal. No bowel obstruction or bowel wall
thickening. No abscess.

No renal or ureteral calculus.  No hydronephrosis.

There is a minimal ventral hernia containing only fat.
